# Patient Record
Sex: Male | Born: 2000 | Hispanic: Yes | Marital: Single
Health system: Southern US, Community
[De-identification: ages and names within clinical notes are randomized; demographics above are authoritative.]

## PROBLEM LIST (undated history)

## (undated) ENCOUNTER — Emergency Department: Payer: Medicaid Other

---

## 2020-11-12 ENCOUNTER — Emergency Department: Payer: Medicaid Other

## 2020-11-12 ENCOUNTER — Emergency Department
Admission: EM | Admit: 2020-11-12 | Discharge: 2020-11-12 | Disposition: A | Discharge status: Home / Self Care | Payer: Medicaid Other | Attending: Emergency Medicine | Admitting: Emergency Medicine

## 2020-11-12 ENCOUNTER — Other Ambulatory Visit: Payer: Self-pay

## 2020-11-12 DIAGNOSIS — M545 Low back pain, unspecified: Secondary | ICD-10-CM | POA: Diagnosis not present

## 2020-11-12 DIAGNOSIS — W11XXXA Fall on and from ladder, initial encounter: Secondary | ICD-10-CM | POA: Insufficient documentation

## 2020-11-12 DIAGNOSIS — M542 Cervicalgia: Secondary | ICD-10-CM | POA: Insufficient documentation

## 2020-11-12 DIAGNOSIS — S4992XA Unspecified injury of left shoulder and upper arm, initial encounter: Secondary | ICD-10-CM | POA: Diagnosis present

## 2020-11-12 DIAGNOSIS — Y990 Civilian activity done for income or pay: Secondary | ICD-10-CM | POA: Insufficient documentation

## 2020-11-12 MED ORDER — METHOCARBAMOL 500 MG PO TABS: 500.0000 mg | ORAL_TABLET | Freq: Three times a day (TID) | ORAL | 0 refills | Status: AC | PRN

## 2020-11-12 NOTE — ED Triage Notes (Signed)
Pt states last Friday while working on an extension ladder holding on to power chords he almost fell and caught himself, pt reports in doing so he arched his back and has been having pain in the lower back and right shoulder.

## 2020-11-12 NOTE — ED Provider Notes (Signed)
[facility]-EMERGENCY DEPARTMENT  ____________________________________________  Time seen: Approximately 11:30 PM  I have reviewed the triage vital signs and the nursing notes.   HISTORY  Chief Complaint Back Pain and Shoulder Injury   Historian Patient     HPI Ronald Cain is a 20 y.o. male presents to the emergency department with neck pain and low back pain.  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  Patient states that he has continued to have pain.  Patient reports that he has had tingling in the left hand since injury occurred.  He has been able to ambulate.  No weakness in the lower extremities.  No similar injuries in the past.   No past medical history on file.   Immunizations up to date:  Yes.     No past medical history on file.  There are no problems to display for this patient.     Prior to Admission medications   Medication Sig Start Date End Date Taking? Authorizing Provider  methocarbamol (ROBAXIN) 500 MG tablet Take 1 tablet (500 mg total) by mouth every 8 (eight) hours as needed for up to 5 days. 11/12/20 11/17/20 Yes Orvil Feil, PA-C    Allergies Patient has no known allergies.  No family history on file.  Social History     Review of Systems  Constitutional: No fever/chills Eyes:  No discharge ENT: No upper respiratory complaints. Respiratory: no cough. No SOB/ use of accessory muscles to breath Gastrointestinal:   No nausea, no vomiting.  No diarrhea.  No constipation. Musculoskeletal: Patient has neck pain and low back pain.  Skin: Negative for rash, abrasions, lacerations, ecchymosis.    ____________________________________________   PHYSICAL EXAM:  VITAL SIGNS: ED Triage Vitals  Enc Vitals Group     BP 11/12/20 1753 126/74     Pulse Rate 11/12/20 1753 76     Resp 11/12/20 1753 16     Temp 11/12/20 1753 98.4 F (36.9 C)     Temp  Source 11/12/20 1753 Oral     SpO2 11/12/20 1753 98 %     Weight 11/12/20 1754 194 lb (88 kg)     Height 11/12/20 1754 6' (1.829 m)     Head Circumference --      Peak Flow --      Pain Score 11/12/20 1754 7     Pain Loc --      Pain Edu? --      Excl. in GC? --      Constitutional: Alert and oriented. Well appearing and in no acute distress. Eyes: Conjunctivae are normal. PERRL. EOMI. Head: Atraumatic. ENT:      Nose: No congestion/rhinnorhea.      Mouth/Throat: Mucous membranes are moist.  Neck: No stridor.  Full range of motion.  No midline C-spine tenderness to palpation. Cardiovascular: Normal rate, regular rhythm. Normal S1 and S2.  Good peripheral circulation. Respiratory: Normal respiratory effort without tachypnea or retractions. Lungs CTAB. Good air entry to the bases with no decreased or absent breath sounds Gastrointestinal: Bowel sounds x 4 quadrants. Soft and nontender to palpation. No guarding or rigidity. No distention. Musculoskeletal: Full range of motion to all extremities. No obvious deformities noted.  Patient has midline lumbar spine tenderness. Neurologic:  Normal for age. No gross focal neurologic deficits are appreciated.  Skin:  Skin is warm, dry and intact. No rash noted. Psychiatric: Mood and affect are  normal for age. Speech and behavior are normal.   ____________________________________________   LABS (all labs ordered are listed, but only abnormal results are displayed)  Labs Reviewed - No data to display ____________________________________________  EKG   ____________________________________________  RADIOLOGY Geraldo Pitter, personally viewed and evaluated these images (plain radiographs) as part of my medical decision making, as well as reviewing the written report by the radiologist.  DG Lumbar Spine 2-3 Views  Result Date: 11/12/2020 CLINICAL DATA:  Back injury on ladder EXAM: LUMBAR SPINE - 2-3 VIEW COMPARISON:  None. FINDINGS:  There is no evidence of lumbar spine fracture. Alignment is normal. Intervertebral disc spaces are maintained. IMPRESSION: Negative. Electronically Signed   By: Deatra Robinson M.D.   On: 11/12/2020 20:50   CT Cervical Spine Wo Contrast  Result Date: 11/12/2020 CLINICAL DATA:  Neck pain after injury EXAM: CT CERVICAL SPINE WITHOUT CONTRAST TECHNIQUE: Multidetector CT imaging of the cervical spine was performed without intravenous contrast. Multiplanar CT image reconstructions were also generated. COMPARISON:  None. FINDINGS: Alignment: Facet joints are aligned without dislocation or traumatic listhesis. Dens and lateral masses are aligned. Straightening with slight reversal of the cervical lordosis. Skull base and vertebrae: No acute fracture. No primary bone lesion or focal pathologic process. Soft tissues and spinal canal: No prevertebral fluid or swelling. No visible canal hematoma. Disc levels:  Unremarkable. Upper chest: Included lung apices are clear. Other: None. IMPRESSION: 1. No acute fracture or traumatic listhesis of the cervical spine. 2. Straightening with slight reversal of the cervical lordosis, which may be due to positioning or muscle spasm. Electronically Signed   By: Duanne Guess D.O.   On: 11/12/2020 20:52    ____________________________________________    PROCEDURES  Procedure(s) performed:     Procedures     Medications - No data to display   ____________________________________________   INITIAL IMPRESSION / ASSESSMENT AND PLAN / ED COURSE  Pertinent labs & imaging results that were available during my care of the patient were reviewed by me and considered in my medical decision making (see chart for details).      Assessment and plan Neck pain Low back 20 year old male presents to the emergency department with neck pain and low back pain after an injury at work.  CT of the cervical spine showed no acute bony abnormality.  No bony abnormality was  visualized on x-ray of the lumbar spine.  Patient was discharged with Robaxin and advised to follow-up with primary care as needed.    ____________________________________________  FINAL CLINICAL IMPRESSION(S) / ED DIAGNOSES  Final diagnoses:  Neck pain  Acute midline low back pain without sciatica      NEW MEDICATIONS STARTED DURING THIS VISIT:  ED Discharge Orders          Ordered    methocarbamol (ROBAXIN) 500 MG tablet  Every 8 hours PRN        11/12/20 2200                This chart was dictated using voice recognition software/Dragon. Despite best efforts to proofread, errors can occur which can change the meaning. Any change was purely unintentional.     Gasper Lloyd 11/12/20 2335    Sharman Cheek, MD 11/12/20 2356

## 2020-11-12 NOTE — Discharge Instructions (Addendum)
You can take Robaxin up to three times daily for five days.

## 2022-08-10 IMAGING — CT CT CERVICAL SPINE W/O CM
3 of 4 series · 13 of 33 positions shown, 16 images · non-contrast
Comparison: None.

CLINICAL DATA: Neck pain after injury

EXAM:
CT CERVICAL SPINE WITHOUT CONTRAST
TECHNIQUE: Multidetector CT imaging of the cervical spine was performed without
intravenous contrast. Multiplanar CT image reconstructions were also
generated.

[Series 4: sagittal bone · sagittal · 0.28mm/px · 5 of 61 slices shown, 6 images]
[im 21/61  bone]
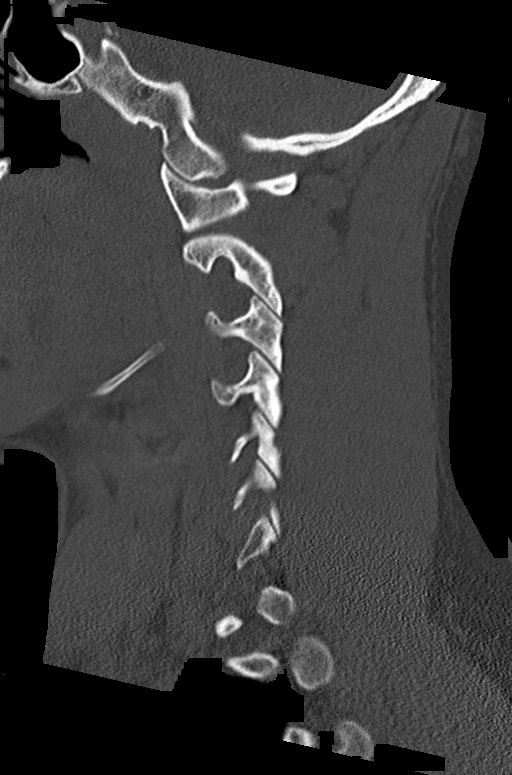
[im 26/61  bone]
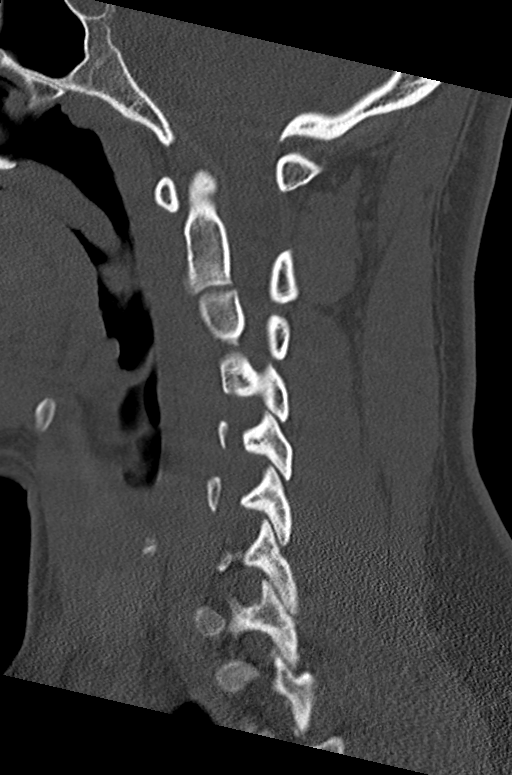
[im 31/61  soft-tissue]
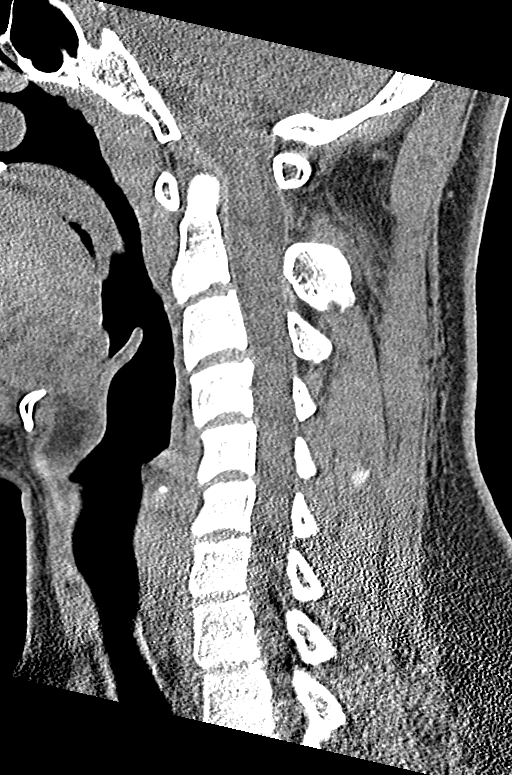
[im 31/61  bone]
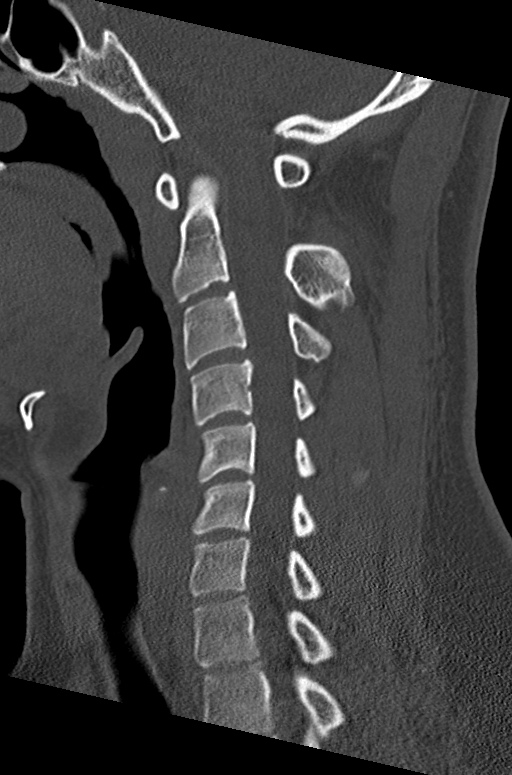
[im 36/61  bone]
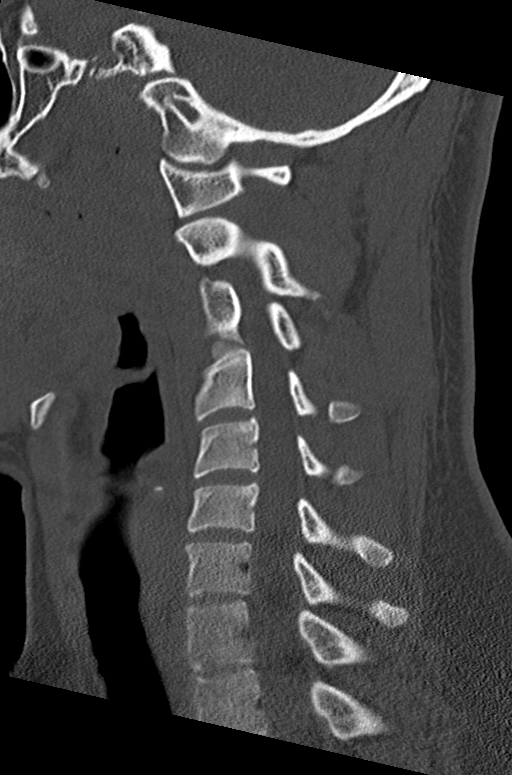
[im 41/61  bone]
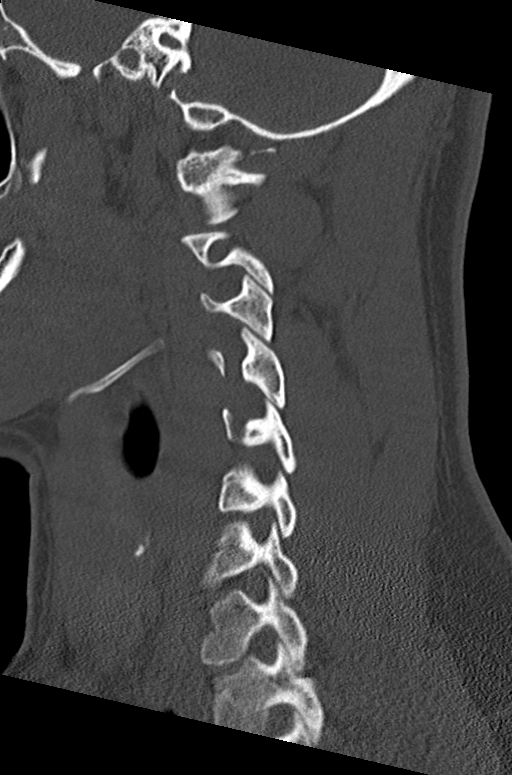

[Series 5: coronal bone · coronal · 0.28mm/px · 3 of 61 slices shown]
[im 13/61  bone]
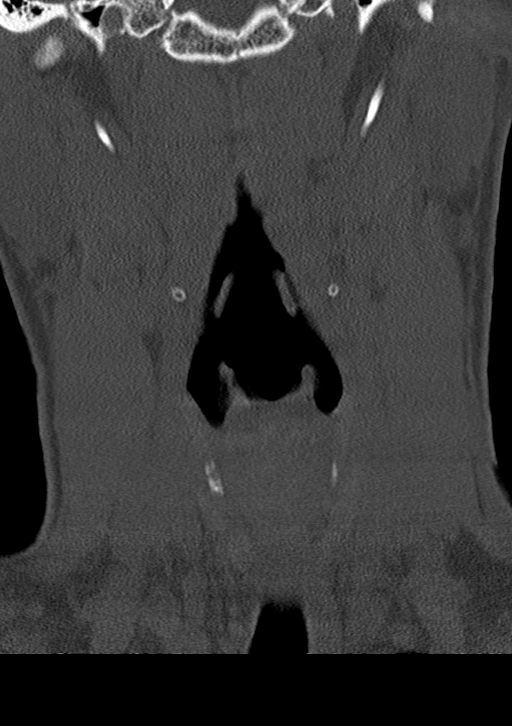
[im 25/61  bone]
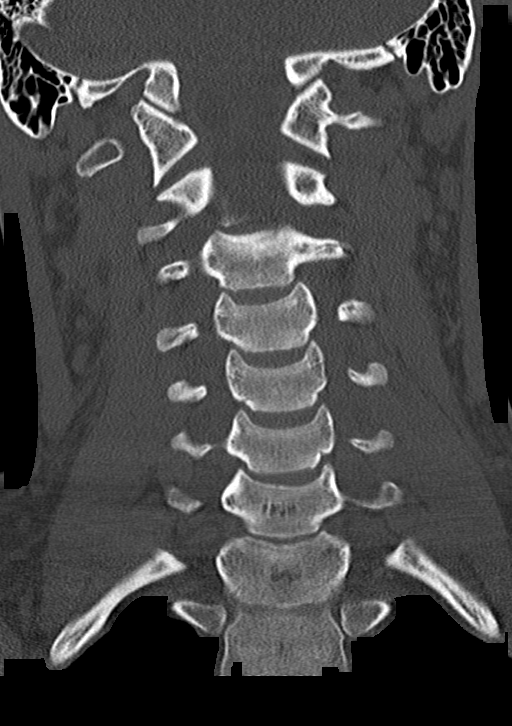
[im 37/61  bone]
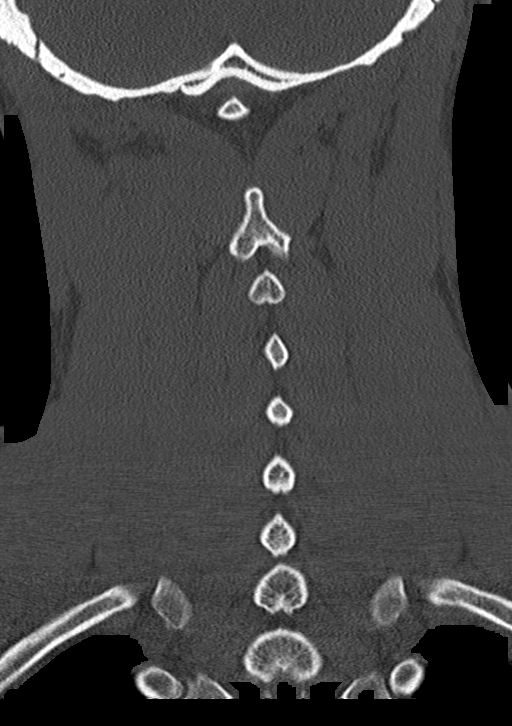

[Series 6: orthogonal bone · axial · 0.23mm/px · z∈[-289,-164]mm · 5 of 99 slices shown, 7 images]
[im 17/99  soft-tissue]
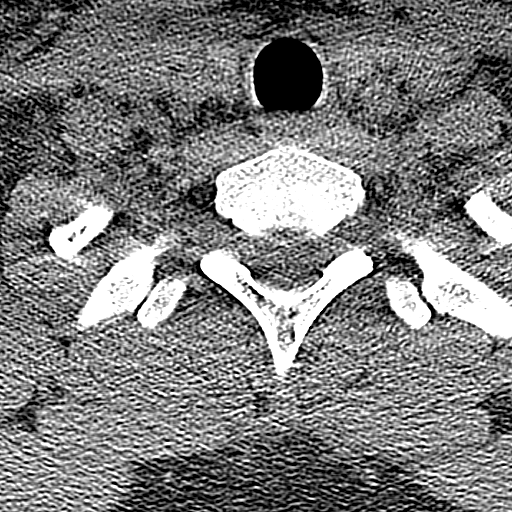
[im 17/99  bone]
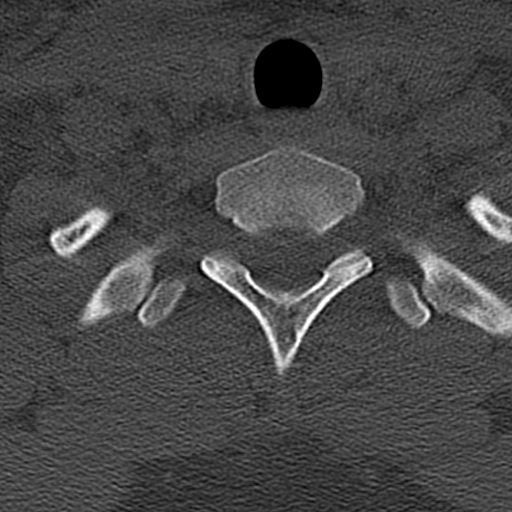
[im 33/99  bone]
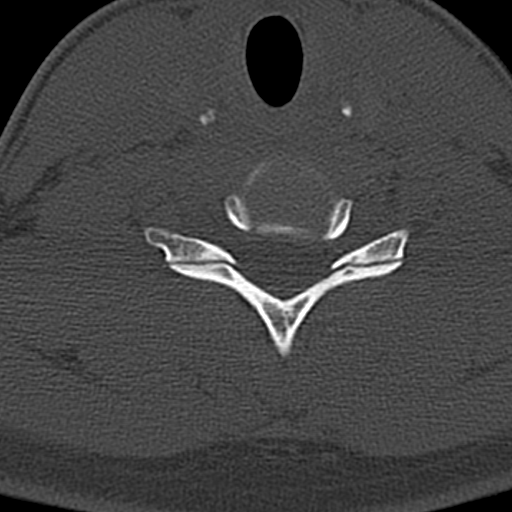
[im 50/99  bone]
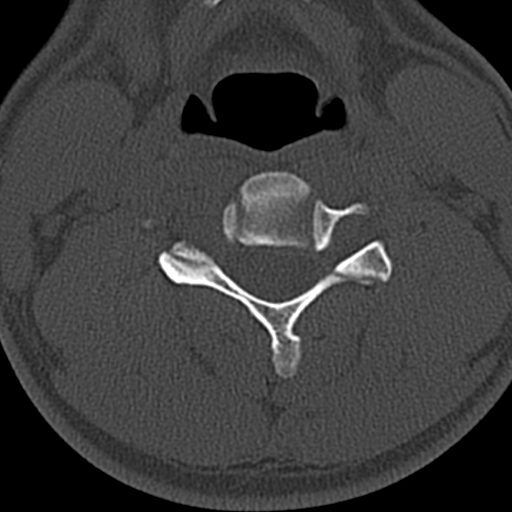
[im 66/99  bone]
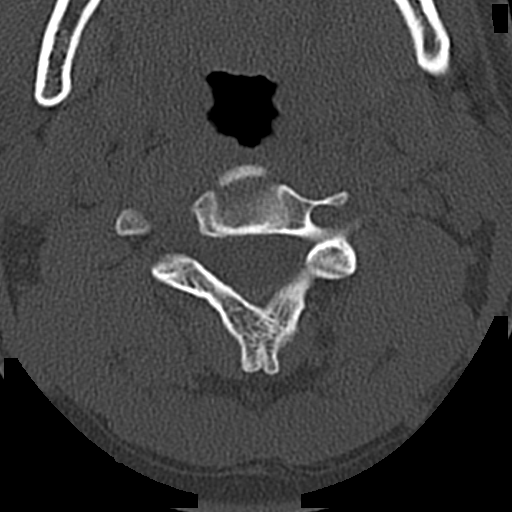
[im 82/99  soft-tissue]
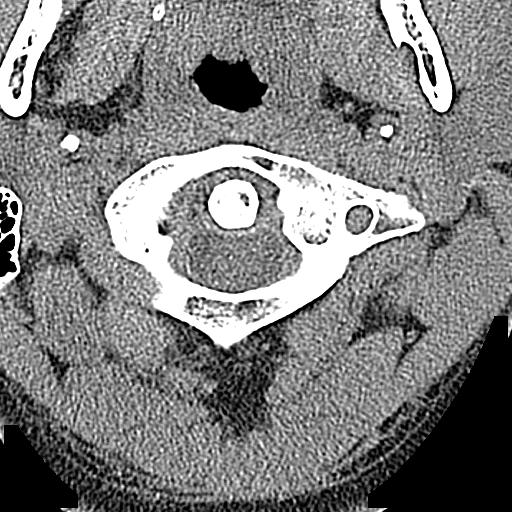
[im 82/99  bone]
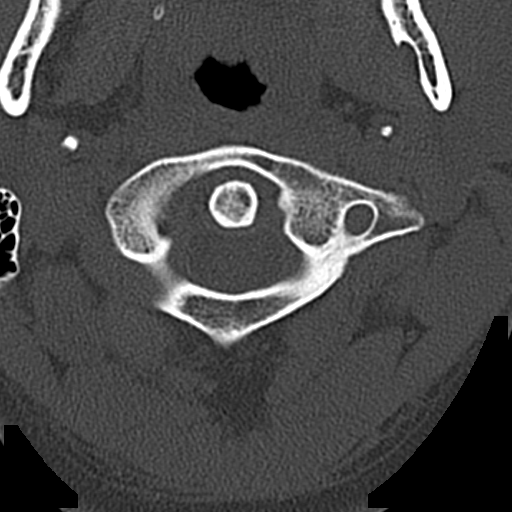

[13 of 33 positions shown; findings below may reference images not displayed]

FINDINGS: Alignment: Facet joints are aligned without dislocation or traumatic
listhesis. Dens and lateral masses are aligned. Straightening with
slight reversal of the cervical lordosis.

Skull base and vertebrae: No acute fracture. No primary bone lesion
or focal pathologic process.

Soft tissues and spinal canal: No prevertebral fluid or swelling. No
visible canal hematoma.

Disc levels:  Unremarkable.

Upper chest: Included lung apices are clear.

Other: None.
IMPRESSION: 1. No acute fracture or traumatic listhesis of the cervical spine.
2. Straightening with slight reversal of the cervical lordosis,
which may be due to positioning or muscle spasm.

## 2023-10-03 ENCOUNTER — Emergency Department (HOSPITAL_COMMUNITY)
Admission: EM | Admit: 2023-10-03 | Discharge: 2023-10-04 | Disposition: A | Discharge status: Home / Self Care | Attending: Emergency Medicine | Admitting: Emergency Medicine

## 2023-10-03 DIAGNOSIS — Z4801 Encounter for change or removal of surgical wound dressing: Secondary | ICD-10-CM | POA: Insufficient documentation

## 2023-10-03 DIAGNOSIS — Z5189 Encounter for other specified aftercare: Secondary | ICD-10-CM

## 2023-10-04 ENCOUNTER — Encounter (HOSPITAL_COMMUNITY): Payer: Self-pay | Admitting: *Deleted

## 2023-10-04 ENCOUNTER — Other Ambulatory Visit: Payer: Self-pay

## 2023-10-04 LAB — URINALYSIS, ROUTINE W REFLEX MICROSCOPIC
Bilirubin Urine: NEGATIVE
Glucose, UA: NEGATIVE mg/dL
Hgb urine dipstick: NEGATIVE
Ketones, ur: NEGATIVE mg/dL
Leukocytes,Ua: NEGATIVE
Nitrite: NEGATIVE
Protein, ur: NEGATIVE mg/dL
Specific Gravity, Urine: 1.025 (ref 1.005–1.030)
pH: 5 (ref 5.0–8.0)

## 2023-10-04 LAB — COMPREHENSIVE METABOLIC PANEL WITH GFR
ALT: 32 U/L (ref 0–44)
AST: 29 U/L (ref 15–41)
Albumin: 4.1 g/dL (ref 3.5–5.0)
Alkaline Phosphatase: 80 U/L (ref 38–126)
Anion gap: 10 (ref 5–15)
BUN: 13 mg/dL (ref 6–20)
CO2: 26 mmol/L (ref 22–32)
Calcium: 9.7 mg/dL (ref 8.9–10.3)
Chloride: 104 mmol/L (ref 98–111)
Creatinine, Ser: 1.17 mg/dL (ref 0.61–1.24)
GFR, Estimated: >60 mL/min (ref >60)
Glucose, Bld: 96 mg/dL (ref 70–99)
Potassium: 4.1 mmol/L (ref 3.5–5.1)
Sodium: 140 mmol/L (ref 135–145)
Total Bilirubin: 0.5 mg/dL (ref 0.0–1.2)
Total Protein: 7.2 g/dL (ref 6.5–8.1)

## 2023-10-04 LAB — I-STAT CG4 LACTIC ACID, ED: Lactic Acid, Venous: 1.4 mmol/L (ref 0.5–1.9)

## 2023-10-04 LAB — CBC
HCT: 41.8 % (ref 39.0–52.0)
Hemoglobin: 14.5 g/dL (ref 13.0–17.0)
MCH: 29.8 pg (ref 26.0–34.0)
MCHC: 34.7 g/dL (ref 30.0–36.0)
MCV: 85.8 fL (ref 80.0–100.0)
Platelets: 241 10*3/uL (ref 150–400)
RBC: 4.87 MIL/uL (ref 4.22–5.81)
RDW: 12.2 % (ref 11.5–15.5)
WBC: 8.4 10*3/uL (ref 4.0–10.5)
nRBC: 0 % (ref 0.0–0.2)

## 2023-10-04 LAB — LIPASE, BLOOD: Lipase: 30 U/L (ref 11–51)

## 2023-10-04 MED ORDER — DOXYCYCLINE HYCLATE 100 MG PO CAPS: 100.0000 mg | ORAL_CAPSULE | Freq: Two times a day (BID) | ORAL | 0 refills | Status: AC

## 2023-10-04 MED ORDER — DOXYCYCLINE HYCLATE 100 MG PO TABS
100.0000 mg | ORAL_TABLET | Freq: Once | ORAL | Status: AC
  Administered 2023-10-04: 100 mg via ORAL
  Filled 2023-10-04: qty 1
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
## 2023-10-04 NOTE — Discharge Instructions (Addendum)
 It was a pleasure taking part in your care.  As discussed, I am placing you on an antibiotic called doxycycline.  You take this twice a day for 7 days.  Please continue to care for the wound at home, cleanse it with Dove soap.  Please do not submerge the wound in the bodies of water.  You may take showers and allow water to run over the wound.  Please keep the wound covered.  Please follow-up with occupational health for further care.  If you develop fevers, nausea or vomiting please return to the ED for further management and care.

## 2023-10-09 LAB — CULTURE, BLOOD (ROUTINE X 2)
Culture: NO GROWTH
Culture: NO GROWTH
